# Patient Record
Sex: Female | Born: 1945 | Race: White | Hispanic: No
Health system: Southern US, Community
[De-identification: ages and names within clinical notes are randomized; demographics above are authoritative.]

---

## 2013-12-12 ENCOUNTER — Observation Stay: Payer: Self-pay | Admitting: Internal Medicine

## 2013-12-12 LAB — COMPREHENSIVE METABOLIC PANEL
ALK PHOS: 57 U/L
Albumin: 3.2 g/dL — ABNORMAL LOW (ref 3.4–5.0)
Anion Gap: 4 — ABNORMAL LOW (ref 7–16)
BUN: 12 mg/dL (ref 7–18)
Bilirubin,Total: 0.5 mg/dL (ref 0.2–1.0)
Calcium, Total: 8.6 mg/dL (ref 8.5–10.1)
Chloride: 110 mmol/L — ABNORMAL HIGH (ref 98–107)
Co2: 27 mmol/L (ref 21–32)
Creatinine: 0.83 mg/dL (ref 0.60–1.30)
EGFR (Non-African Amer.): >60
Glucose: 100 mg/dL — ABNORMAL HIGH (ref 65–99)
OSMOLALITY: 281 (ref 275–301)
POTASSIUM: 4.2 mmol/L (ref 3.5–5.1)
SGOT(AST): 27 U/L (ref 15–37)
SGPT (ALT): 22 U/L
SODIUM: 141 mmol/L (ref 136–145)
Total Protein: 6.8 g/dL (ref 6.4–8.2)

## 2013-12-12 LAB — CBC
HCT: 40 % (ref 35.0–47.0)
HGB: 13.4 g/dL (ref 12.0–16.0)
MCH: 32.6 pg (ref 26.0–34.0)
MCHC: 33.5 g/dL (ref 32.0–36.0)
MCV: 97 fL (ref 80–100)
PLATELETS: 213 10*3/uL (ref 150–440)
RBC: 4.11 10*6/uL (ref 3.80–5.20)
RDW: 14.2 % (ref 11.5–14.5)
WBC: 6.9 10*3/uL (ref 3.6–11.0)

## 2013-12-12 LAB — TROPONIN I
Troponin-I: <0.02 ng/mL
Troponin-I: <0.02 ng/mL
Troponin-I: <0.02 ng/mL

## 2013-12-13 LAB — LIPID PANEL
CHOLESTEROL: 134 mg/dL (ref 0–200)
HDL: 45 mg/dL (ref 40–60)
Ldl Cholesterol, Calc: 72 mg/dL (ref 0–100)
Triglycerides: 85 mg/dL (ref 0–200)
VLDL Cholesterol, Calc: 17 mg/dL (ref 5–40)

## 2014-09-05 NOTE — Discharge Summary (Signed)
Dates of Admission and Diagnosis:  Date of Admission 12-Dec-2013   Date of Discharge 13-Dec-2013   Admitting Diagnosis chest pain   Final Diagnosis Chest pain- stress test negative. Htn Smoking Anxiety.    Chief Complaint/History of Present Illness a 69 year old female who presented to the Emergency Room complaining of sharp chest pain that began earlier today. The patient describes the pain as being sharp, located in the center of her chest, non-radiating, not associated with any shortness of breath, nausea, vomiting, diaphoresis, or palpitations. The patient took a sublingual nitroglycerin and it improved her chest pain from a 9 to a 1. She then came to the Emergency Room for further evaluation. While she was in the ER, she had another episode of chest pain, which was instantly relieved with some nitroglycerin.  Given the recurrence of her chest pain, hospitalist services were contacted for further treatment and evaluation.   Allergies:  No Known Allergies:   Hepatic:  31-Jul-15 11:20   Bilirubin, Total 0.5  Alkaline Phosphatase 57 (46-116 NOTE: New Reference Range 12/02/13)  SGPT (ALT) 22 (14-63 NOTE: New Reference Range 12/02/13)  SGOT (AST) 27  Total Protein, Serum 6.8  Albumin, Serum  3.2  Routine Chem:  31-Jul-15 11:20   Glucose, Serum  100  BUN 12  Creatinine (comp) 0.83  Sodium, Serum 141  Potassium, Serum 4.2  Chloride, Serum  110  CO2, Serum 27  Calcium (Total), Serum 8.6  Osmolality (calc) 281  eGFR (African American) >60  eGFR (Non-African American) >60 (eGFR values <71m/min/1.73 m2 may be an indication of chronic kidney disease (CKD). Calculated eGFR is useful in patients with stable renal function. The eGFR calculation will not be reliable in acutely ill patients when serum creatinine is changing rapidly. It is not useful in  patients on dialysis. The eGFR calculation may not be applicable to patients at the low and high extremes of body sizes,  pregnant women, and vegetarians.)  Anion Gap  4  01-Aug-15 06:21   Cholesterol, Serum 134  Triglycerides, Serum 85  HDL (INHOUSE) 45  VLDL Cholesterol Calculated 17  LDL Cholesterol Calculated 72 (Result(s) reported on 13 Dec 2013 at 06:48AM.)  Cardiac:  31-Jul-15 11:20   Troponin I < 0.02 (0.00-0.05 0.05 ng/mL or less: NEGATIVE  Repeat testing in 3-6 hrs  if clinically indicated. >0.05 ng/mL: POTENTIAL  MYOCARDIAL INJURY. Repeat  testing in 3-6 hrs if  clinically indicated. NOTE: An increase or decrease  of 30% or more on serial  testing suggests a  clinically important change)    14:15   Troponin I < 0.02 (0.00-0.05 0.05 ng/mL or less: NEGATIVE  Repeat testing in 3-6 hrs  if clinically indicated. >0.05 ng/mL: POTENTIAL  MYOCARDIAL INJURY. Repeat  testing in 3-6 hrs if  clinically indicated. NOTE: An increase or decrease  of 30% or more on serial  testing suggests a  clinically important change)    19:00   Troponin I < 0.02 (0.00-0.05 0.05 ng/mL or less: NEGATIVE  Repeat testing in 3-6 hrs  if clinically indicated. >0.05 ng/mL: POTENTIAL  MYOCARDIAL INJURY. Repeat  testing in 3-6 hrs if  clinically indicated. NOTE: An increase or decrease  of 30% or more on serial  testing suggests a  clinically important change)  Routine Hem:  31-Jul-15 11:20   WBC (CBC) 6.9  RBC (CBC) 4.11  Hemoglobin (CBC) 13.4  Hematocrit (CBC) 40.0  Platelet Count (CBC) 213 (Result(s) reported on 12 Dec 2013 at 11:42AM.)  MCV 97  MCH 32.6  MCHC 33.5  RDW 14.2   PERTINENT RADIOLOGY STUDIES: XRay:    31-Jul-15 11:18, Chest Portable Single View  Chest Portable Single View   REASON FOR EXAM:    Chest Pain  COMMENTS:       PROCEDURE: DXR - DXR PORTABLE CHEST SINGLE VIEW  - Dec 12 2013 11:18AM     CLINICAL DATA:  Two 0.5 hr of chest pain with shortness of breath;  history of tobacco use    EXAM:  PORTABLE CHEST - 1 VIEW    COMPARISON:  None.    FINDINGS:  The lungs are  adequately inflated. There are coarse infrahilar lung  markings on the right. The heart is normal in size. The mediastinum  is normal in width. There is no pleural effusion. There is old  deformity of the posterior lateral aspect of the left sixth rib.     IMPRESSION:  There is no evidence of CHF. Infrahilar atelectasis or pneumonia on  the right is suspected. When the patient can tolerate the procedure,  a PA and lateral chest x-ray would be of value.      Electronically Signed    By: David  Martinique    On: 12/12/2013 11:24       Verified By: DAVID A. Martinique, M.D., MD  LabUnknown:    01-Aug-15 10:41, NM MYOCARDIAL SCAN  PACS Image    Pertinent Past History:  Pertinent Past History a 69 year old female who presented to the Emergency Room complaining of sharp chest pain that began earlier today. The patient describes the pain as being sharp, located in the center of her chest, non-radiating, not associated with any shortness of breath, nausea, vomiting, diaphoresis, or palpitations. The patient took a sublingual nitroglycerin and it improved her chest pain from a 9 to a 1. She then came to the Emergency Room for further evaluation. While she was in the ER, she had another episode of chest pain, which was instantly relieved with some nitroglycerin.  Given the recurrence of her chest pain, hospitalist services were contacted for further treatment and evaluation.   Hospital Course:  Hospital Course a 69 year old female with a history of anxiety, hypertension, ongoing tobacco abuse, who presents to the hospital with acute chest pain.   1.  Chest pain. The patient does have significant risk factors for coronary disease given her hypertension and ongoing tobacco abuse and family history.    negative tele and troponin.   Appreciated cardio consult- stress test done- negative. D/C home.  2.  Hypertension. She is hemodynamically stable.  I will continue her Toprol.   3.  Anxiety. Continue with  the Ativan as needed.   4.  Tobacco abuse. councelled for quiting for 4 min, on nicotine patch.   Condition on Discharge Stable   Code Status:  Code Status Full Code   DISCHARGE INSTRUCTIONS HOME MEDS:  Medication Reconciliation: Patient's Home Medications at Discharge:     Medication Instructions  lorazepam 2 mg oral tablet  1 tab(s) orally 2 times a day   nitroglycerin 0.4 mg sublingual tablet  1 tab(s) sublingual 4 times a day, As Needed, chest pain , As needed, chest pain   metoprolol succinate 25 mg oral tablet, extended release  1 tab(s) orally once a day   nicotine 14 mg/24 hr transdermal film, extended release  1 patch transdermal once a day     Physician's Instructions:  Diet Low Sodium   Activity Limitations None   Return to Work Not Applicable  Time frame for Follow Up Appointment 1-2 weeks  Dr. Nehemiah Massed   Other Comments follow with Dr. Nehemiah Massed in office in next week.     Flavia Shipper Provider): [facility], [address], [place], [place] [address], [place] [phone]  Electronic Signatures: Vaughan Basta (MD)  (Signed 02-Aug-15 12:22)  Authored: ADMISSION DATE AND DIAGNOSIS, CHIEF COMPLAINT/HPI, Allergies, PERTINENT LABS, PERTINENT RADIOLOGY STUDIES, PERTINENT PAST HISTORY, HOSPITAL COURSE, [place], PATIENT INSTRUCTIONS, Follow Up Physician   Last Updated: 02-Aug-15 12:22 by Vaughan Basta (MD)

## 2014-09-05 NOTE — Consult Note (Signed)
PATIENT NAME:  Sherri Franco, Sarea MR#:  [id number] DATE OF BIRTH:  1946-04-22  DATE OF CONSULTATION:  12/14/2013  REFERRING PHYSICIAN:  Rolly PancakeVivek J. Cherlynn KaiserSainani, MD. CONSULTING PHYSICIAN:  Lamar BlinksBruce J. Stella Encarnacion, MD  REASON FOR CONSULTATION: Chest discomfort with cardiovascular disease risks.   CHIEF COMPLAINT: "I have had chest pain."   HISTORY OF PRESENT ILLNESS: This is an elderly female with known cardiovascular risk factors, hypertension, hyperlipidemia, who has had no evidence of previous heart attacks, having episodes of substernal chest pain, severe in nature, radiating into her back over a several-hour period. The patient did have some relief of this with appropriate treatment with Emergency Room visit. The patient has had no further episodes. EKG shows normal sinus rhythm with no evidence of myocardial infarction, and troponin/CK-MB are within normal limits. Remainder of her review of systems is negative for vision change, ringing in the ears, hearing loss, cough, congestion, heartburn, nausea, vomiting, diarrhea, bloody stools, stomach pain, extremity pain, leg weakness, cramping in the buttocks, known blood clots, headaches, blackouts, dizzy spells, nosebleeds, congestion, trouble swallowing, frequent urination, urination at night, muscle weakness, numbness, anxiety, depression, skin lesions or skin rashes.   PAST MEDICAL HISTORY:  1.  Hypertension.  2.  Hyperlipidemia.   FAMILY HISTORY: Father had early onset of cardiovascular disease. Mother had hypertension.   SOCIAL HISTORY: Currently denies alcohol or tobacco use.   ALLERGIES:  No known drug allergies.   CURRENT MEDICATIONS: As listed.   PHYSICAL EXAMINATION:  VITAL SIGNS: Blood pressure is 134/68 bilaterally. Heart rate 72 upright and reclining, regular.  GENERAL: She is a well-appearing female in no acute distress.   HEENT: No icterus, thyromegaly, ulcers, hemorrhage, or xanthelasma. CARDIOVASCULAR: Regular rate and rhythm with normal  S1 and S2 without murmur, gallop, or rub. PMI is diffuse. Carotid upstroke normal without bruit. Jugular venous pressure is normal.  LUNGS: Clear to auscultation with normal respirations.  ABDOMEN: Soft, nontender, without hepatosplenomegaly or masses. Abdominal aorta is normal in size without bruit.  EXTREMITIES: 2+ radial, femoral, dorsal pedal pulses, with no lower extremity edema, cyanosis, clubbing or ulcers.  NEUROLOGIC: She is oriented to time, place, and person, with normal mood and affect.  ASSESSMENT: Elderly female with hypertension, hyperlipidemia, chest pain without evidence of current myocardial infarction needing further treatment options.   RECOMMENDATIONS:  1.  Lexiscan infusion stress test for further evaluation of ischemia and treatment options.  2.  Hypertension control with current medical regimen.  3.  Begin ambulation following for any further significant symptoms requiring further adjustments of medications.  4.  Possible discharge home with follow up thereafter.    ____________________________ Lamar BlinksBruce J. Nakeyia Menden, MD bjk:lt D: 12/14/2013 08:57:03 ET T: 12/14/2013 09:47:34 ET JOB#: [id number]  cc: Lamar BlinksBruce J. Taysen Bushart, MD, <Dictator> Lamar BlinksBRUCE J Jahlia Omura MD ELECTRONICALLY SIGNED 12/24/2013 10:16

## 2014-09-05 NOTE — H&P (Signed)
PATIENT NAME:  Sherri Franco, Sherri Franco MR#:  [id number] DATE OF BIRTH:  [birth year]  DATE OF ADMISSION:  12/12/2013  PRIMARY CARE PHYSICIAN: [place]ated in [place].   CHIEF COMPLAINT: Chest pain.   HISTORY OF PRESENT ILLNESS: This is a 69 year old female who presented to the Emergency Room complaining of sharp chest pain that began earlier today. The patient describes the pain as being sharp, located in the center of her chest, non-radiating, not associated with any shortness of breath, nausea, vomiting, diaphoresis, or palpitations. The patient took a sublingual nitroglycerin and it improved her chest pain from a 9 to a 1. She then came to the Emergency Room for further evaluation. While she was in the ER, she had another episode of chest pain, which was instantly relieved with some nitroglycerin.  Given the recurrence of her chest pain, hospitalist services were contacted for further treatment and evaluation.   REVIEW OF SYSTEMS:  CONSTITUTIONAL: No documented fever. No weight gain, no weight loss.  EYES: No blurred or double vision.  EARS, NOSE, AND THROAT: No tinnitus. No postnasal drip. No redness of the oropharynx.  RESPIRATORY: No cough, no wheeze, no hemoptysis, no dyspnea.  CARDIOVASCULAR: Positive chest pain. No orthopnea. No palpitations, no syncope.  GASTROINTESTINAL: No nausea, vomiting, diarrhea, no abdominal pain. No melena or hematochezia.  GENITOURINARY: No dysuria and hematuria.  ENDOCRINE: No polyuria or nocturia. No her cold intolerance.  HEMATOLOGIC: No anemia. No bruising. No bleeding.  INTEGUMENTARY: No rashes or lesions.  MUSCULOSKELETAL: No arthritis. No swelling. No gout.  NEUROLOGIC: No numbness or tingling. No ataxia. No seizure-type activity.  PSYCHIATRIC: Positive anxiety. No insomnia. No ADD disorder.   PAST MEDICAL HISTORY: Consistent with hypertension, tobacco abuse, and anxiety.   ALLERGIES: NO KNOWN DRUG ALLERGIES.   SOCIAL HISTORY: She does smoke about a pack per  day, has been smoking for the past 40 to 50 years. No alcohol abuse. No illicit drug abuse. Lives at home by herself.   FAMILY HISTORY: Mother and father are both deceased. Father died from lung cancer. Mother died from complications of myocardial infarction.   CURRENT MEDICATIONS: As follows:  1.  Toprol unknown milligram dose daily.  2.  Lorazepam 2 mg b.i.d.   PHYSICAL EXAMINATION: Presently is as follows:  VITAL SIGNS:  Noted to be temperature is 98.2, pulse 65, respirations 12, blood pressure 114/72, saturations 100% on 2 liters nasal cannula.  GENERAL: She is a pleasant -appearing female in no apparent distress.  HEENT:  Atraumatic, normocephalic. Extraocular muscles are intact. Pupils equal and reactive  to light. Sclerae anicteric. No conjunctival injection. No pharyngeal erythema.  NECK: Supple. There is no jugular venous distention. No bruits, no lymphadenopathy, no thyromegaly.  HEART: Regular rate and rhythm. No murmurs. No rubs. No clicks.  LUNGS: Clear to auscultation bilaterally. No rales or rhonchi. No wheezes.  ABDOMEN: Soft, flat, nontender, nondistended. Has good bowel sounds. No hepatosplenomegaly appreciated.  EXTREMITIES: No evidence of any cyanosis, clubbing, or peripheral edema. Has +2 pedal and radial pulses bilaterally.  NEUROLOGICAL: The patient is alert, awake, and oriented x 3 with no focal motor or sensory deficits appreciated bilaterally.  SKIN: Moist and warm with no rashes appreciated.  LYMPHATIC: There is no cervical or axillary adenopathy.   LABORATORY, DIAGNOSTIC AND RADIOLOGICAL DATA: Serum glucose of 100, BUN 12, creatinine 0.8, sodium 141, potassium 4.2, chloride 110, bicarbonate 27. The patient's LFTs are within normal limits. Troponin less than 0.02. White cell count 6.9, hemoglobin 13.4, hematocrit 40.0, platelet count 213,000.  The patient did have a chest x-ray done which showed no evidence of CHF or pneumonia.   The patient also had an EKG done  which showed normal sinus rhythm with normal axis and no evidence of acute ST or T wave changes.   ASSESSMENT AND PLAN: This is a 69 year old female with a history of anxiety, hypertension, ongoing tobacco abuse, who presents to the hospital with acute chest pain.   1.  Chest pain. The patient does have significant risk factors for coronary disease given her hypertension and ongoing tobacco abuse and family history. I will observe her overnight on telemetry for now. Follow serial cardiac.  Her first troponin is negative. I will get a Myoview in the morning. I will continue with aspirin, beta blocker, nitroglycerin, and oxygen for now. We will check a lipid profile in the morning.   2.  Hypertension. She is hemodynamically stable.  I will continue her Toprol.   3.  Anxiety. Continue with the Ativan as needed.   4.  Tobacco abuse. I will place the patient on nicotine patch.   CODE STATUS: THE PATIENT IS A FULL CODE.   TIME SPENT ON ADMISSION: 45 minutes.    ____________________________ Rolly Pancake. Cherlynn Kaiser, MD vjs:ts D: 12/12/2013 13:45:29 ET T: 12/12/2013 14:08:00 ET JOB#: [id number]  cc: Rolly Pancake. Cherlynn Kaiser, MD, <Dictator> Houston Siren MD ELECTRONICALLY SIGNED 12/26/2013 14:21

## 2014-12-20 IMAGING — NM CT OUTSIDE FILMS CHEST
8 series · 48 of 48 positions shown · non-contrast
Comparison: none

[Series 1000: stress_dc · 4.80mm/px · 6 of 68 frames shown]
[frame 6/68]
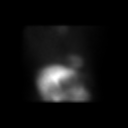
[frame 17/68]
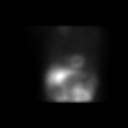
[frame 29/68]
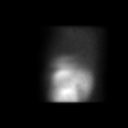
[frame 40/68]
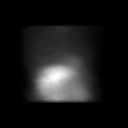
[frame 51/68]
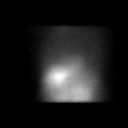
[frame 63/68]
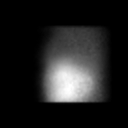

[Series 1000: rest (recon - ac ) · 4.8mm · 4.80mm/px · 6 of 36 frames shown]
[frame 4/36]
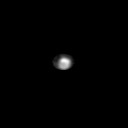
[frame 10/36]
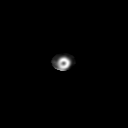
[frame 16/36]
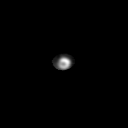
[frame 22/36]
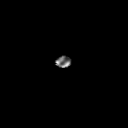
[frame 28/36]
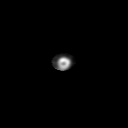
[frame 34/36]
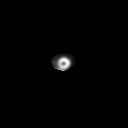

[Series 1000: stress-gated (recon) · 4.8mm · 4.80mm/px · 6 of 256 frames shown]
[frame 22/256]
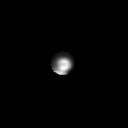
[frame 64/256]
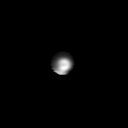
[frame 107/256]
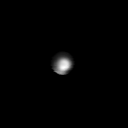
[frame 150/256]
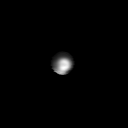
[frame 192/256]
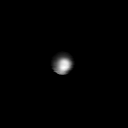
[frame 235/256]
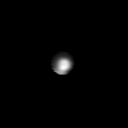

[Series 1000: rest (recon - noac ) · 4.8mm · 4.80mm/px · 6 of 34 frames shown]
[frame 3/34]
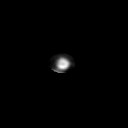
[frame 9/34]
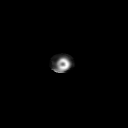
[frame 15/34]
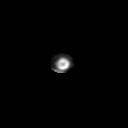
[frame 20/34]
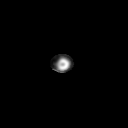
[frame 26/34]
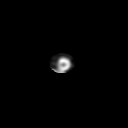
[frame 32/34]
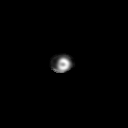

[Series 1000: stress-gated_dc · 4.80mm/px · 6 of 544 frames shown]
[frame 46/544  full-range]
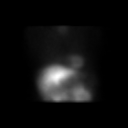
[frame 136/544  full-range]
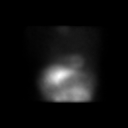
[frame 227/544  full-range]
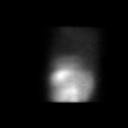
[frame 318/544  full-range]
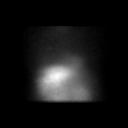
[frame 408/544  full-range]
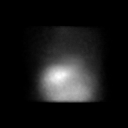
[frame 499/544  full-range]
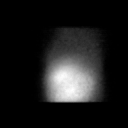

[Series 1000: rest_dc · 4.80mm/px · 6 of 68 frames shown]
[frame 6/68]
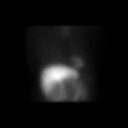
[frame 17/68]
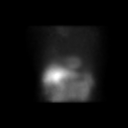
[frame 29/68]
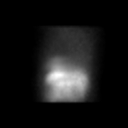
[frame 40/68]
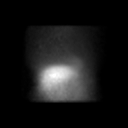
[frame 51/68]
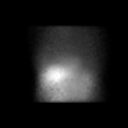
[frame 63/68]
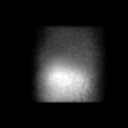

[Series 1000: stress (recon - noac ) · 4.8mm · 4.80mm/px · 6 of 33 frames shown]
[frame 3/33]
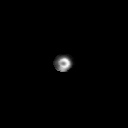
[frame 9/33]
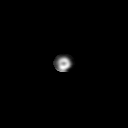
[frame 14/33]
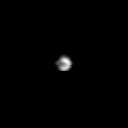
[frame 20/33]
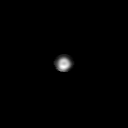
[frame 25/33]
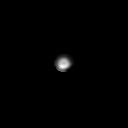
[frame 31/33]
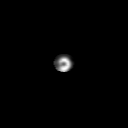

[Series 1000: stress (recon - ac ) · 4.8mm · 4.80mm/px · 6 of 36 frames shown]
[frame 4/36]
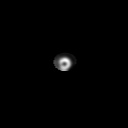
[frame 10/36]
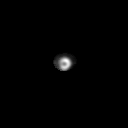
[frame 16/36]
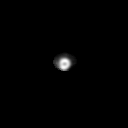
[frame 22/36]
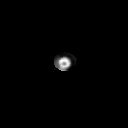
[frame 28/36]
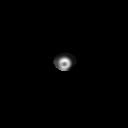
[frame 34/36]
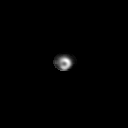

[48 of 48 positions shown; findings below may reference images not displayed]

**** An original report or order could not be provided from the [HOSPITAL] [facility] ****
